# Patient Record
Sex: Male | Born: 1995 | Race: Black or African American | Hispanic: No | Marital: Single | Smoking: Never smoker
Health system: Southern US, Community
[De-identification: ages and names within clinical notes are randomized; demographics above are authoritative.]

---
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
## 2016-03-20 ENCOUNTER — Emergency Department: Payer: BLUE CROSS/BLUE SHIELD

## 2016-03-20 ENCOUNTER — Emergency Department
Admission: EM | Admit: 2016-03-20 | Discharge: 2016-03-20 | Disposition: A | Discharge status: Home / Self Care | Payer: BLUE CROSS/BLUE SHIELD | Attending: Emergency Medicine | Admitting: Emergency Medicine

## 2016-03-20 DIAGNOSIS — Y999 Unspecified external cause status: Secondary | ICD-10-CM | POA: Diagnosis not present

## 2016-03-20 DIAGNOSIS — Y929 Unspecified place or not applicable: Secondary | ICD-10-CM | POA: Insufficient documentation

## 2016-03-20 DIAGNOSIS — Y9367 Activity, basketball: Secondary | ICD-10-CM | POA: Insufficient documentation

## 2016-03-20 DIAGNOSIS — S63612A Unspecified sprain of right middle finger, initial encounter: Secondary | ICD-10-CM | POA: Diagnosis not present

## 2016-03-20 DIAGNOSIS — S6991XA Unspecified injury of right wrist, hand and finger(s), initial encounter: Secondary | ICD-10-CM | POA: Diagnosis present

## 2016-03-20 DIAGNOSIS — W2105XA Struck by basketball, initial encounter: Secondary | ICD-10-CM | POA: Diagnosis not present

## 2016-03-20 DIAGNOSIS — S63619A Unspecified sprain of unspecified finger, initial encounter: Secondary | ICD-10-CM

## 2016-03-20 MED ORDER — NAPROXEN 500 MG PO TABS
500.0000 mg | ORAL_TABLET | Freq: Once | ORAL | Status: AC
  Administered 2016-03-20: 500 mg via ORAL
  Filled 2016-03-20: qty 1

## 2016-03-20 MED ORDER — NAPROXEN 500 MG PO TABS: 500.0000 mg | ORAL_TABLET | Freq: Two times a day (BID) | ORAL | Status: AC

## 2016-03-20 NOTE — ED Notes (Signed)
Playing basketball two weeks ago and injured the right middle finger. PResents today with progressing pain and swelling. States his "mom made him come." Swelling noted to the finger. Motor/sensation intact.

## 2016-03-20 NOTE — ED Provider Notes (Signed)
[facility] Emergency Department Provider Note ____________________________________________  Time seen: Approximately 9:02 PM  I have reviewed the triage vital signs and the nursing notes.   HISTORY  Chief Complaint Finger Injury    HPI Cory Perry is a 20 y.o. male who presents to the emergency department for evaluation of right long finger injury. He states that he injured it while playing basketball about 2 weeks ago, but did not seek medical attention after that. He states that he jammed his finger again today while playing basketball. He states that he has severe pain when he attempts to make a fist on the right hand. He is right-hand dominant. He denies previous fracture or injury to the right hand and middle finger.  History reviewed. No pertinent past medical history.  There are no active problems to display for this patient.   History reviewed. No pertinent past surgical history.  Current Outpatient Rx  Name  Route  Sig  Dispense  Refill  . naproxen (NAPROSYN) 500 MG tablet   Oral   Take 1 tablet (500 mg total) by mouth 2 (two) times daily with a meal.   60 tablet   0     Allergies Review of patient's allergies indicates no known allergies.  No family history on file.  Social History Social History  Substance Use Topics  . Smoking status: None  . Smokeless tobacco: None  . Alcohol Use: None    Review of Systems Constitutional: No recent illness. Cardiovascular: Denies chest pain or palpitations. Respiratory: Denies shortness of breath. Musculoskeletal: Pain in Right hand. Skin: Negative for rash, wound, lesion. Neurological: Negative for focal weakness or numbness.  ____________________________________________   PHYSICAL EXAM:  VITAL SIGNS: ED Triage Vitals  Enc Vitals Group     BP 03/20/16 2010 119/71 mmHg     Pulse Rate 03/20/16 2010 90     Resp 03/20/16 2010 18     Temp 03/20/16 2010 98.7 F (37.1 C)   Temp Source 03/20/16 2010 Oral     SpO2 --      Weight 03/20/16 2010 235 lb (106.595 kg)     Height 03/20/16 2010 6\' 2"  (1.88 m)     Head Cir --      Peak Flow --      Pain Score 03/20/16 2010 8     Pain Loc --      Pain Edu? --      Excl. in GC? --     Constitutional: Alert and oriented. Well appearing and in no acute distress. Eyes: Conjunctivae are normal. EOMI. Head: Atraumatic. Neck: No stridor.  Respiratory: Normal respiratory effort.   Musculoskeletal: Edema noted of the right middle finger at the PIP. There is no erythema or associated lesion. Flexion is painful but possible. Extension is possible without pain. Neurologic:  Normal speech and language. No gross focal neurologic deficits are appreciated. Speech is normal. No gait instability. Skin:  Skin is warm, dry and intact. Atraumatic. Psychiatric: Mood and affect are normal. Speech and behavior are normal.  ____________________________________________   LABS (all labs ordered are listed, but only abnormal results are displayed)  Labs Reviewed - No data to display ____________________________________________  RADIOLOGY  Right middle finger negative for acute bony abnormality.  I, Kem Boroughsari Kaho Selle, personally viewed and evaluated these images (plain radiographs) as part of my medical decision making, as well as reviewing the written report by the radiologist.  ____________________________________________   PROCEDURES  Procedure(s) performed:   Aluminum-Foam  Static finger splint applied to right middle finger by ER tech. Neurovascularly intact post application.    ____________________________________________   INITIAL IMPRESSION / ASSESSMENT AND PLAN / ED COURSE  Pertinent labs & imaging results that were available during my care of the patient were reviewed by me and considered in my medical decision making (see chart for details).  Patient was encouraged to follow up with orthopedics for symptoms that  are not improving over the week. He was encouraged to return to the emergency department for symptoms change or worsen if he is unable schedule appointment. Be given prescription for naproxen. ____________________________________________   FINAL CLINICAL IMPRESSION(S) / ED DIAGNOSES  Final diagnoses:  Sprain of finger of right hand, initial encounter       Chinita Pester, FNP 03/20/16 2210  Jene Every, MD 03/20/16 2234

## 2016-03-20 NOTE — ED Notes (Signed)
Pt presents to ED with Right middle finger pain, swelling, and Limited ROM. Pt states he was playing basketball two weeks ago and injured the right middle finger. Pt states playing basketball again today and injury again.  Pt states progressing pain and swelling. Per Pt his "mom made him come." Swelling noted to the finger. Motor/sensation intact.

## 2016-03-20 NOTE — Discharge Instructions (Signed)
Jammed Finger  A jammed finger is an injury to the ligaments that support your finger bones. Ligaments are strong bands of tissue that connect bones and keep them in place. This injury happens when the ligaments are stretched beyond their normal range of motion (sprained).  CAUSES   A jammed finger is caused by a hard direct hit to the tip of your finger that pushes your finger toward your hand.   RISK FACTORS  This injury is more likely to happen if you play sports.  SYMPTOMS   Symptoms of a jammed finger include:   Pain.   Swelling.   Discoloration and bruising around the joint.   Difficulty bending or straightening the finger.   Not being able to use the finger normally.  DIAGNOSIS   A jammed finger is diagnosed with a medical history and physical exam. You may also have X-rays taken to check for a broken bone (fracture).   TREATMENT   Treatment for a jammed finger may include:   Wearing a splint.   Taping the injured finger to the fingers beside it (buddy taping).   Medicines used to treat pain.  Depending on the type of injury, you may have to do exercises after your finger has begun to heal. This helps you regain strength and mobility in the finger.   HOME CARE INSTRUCTIONS    Take medicines only as directed by your health care provider.   Apply ice to the injured area:     Put ice in a plastic bag.     Place a towel between your skin and the bag.     Leave the ice on for 20 minutes, 2-3 times per day.   Raise the injured area above the level of your heart while you are sitting or lying down.   Wear the splint or tape as directed by your health care provider. Remove it only as directed by your health care provider.   Rest your finger until your health care provider says you can move it again. Your finger may feel stiff and painful for a while.   Perform strengthening exercises as directed by your health care provider. It may help to start doing these exercises with your hand in a bowl of warm  water.   Keep all follow-up visits as directed by your health care provider. This is important.  SEEK MEDICAL CARE IF:   You have pain or swelling that is getting worse.   Your finger feels cold.   Your finger looks out of place at the joint (deformity).   You still cannot extend your finger after treatment.   You have a fever.  SEEK IMMEDIATE MEDICAL CARE IF:    Even after loosening your splint, your finger:    Is very red and swollen.    Is white or blue.    Feels tingly or becomes numb.     This information is not intended to replace advice given to you by your health care provider. Make sure you discuss any questions you have with your health care provider.     Document Released: 03/29/2010 Document Revised: 10/30/2014 Document Reviewed: 08/12/2014  Elsevier Interactive Patient Education 2016 [employer]

## 2016-10-20 ENCOUNTER — Emergency Department
Admission: EM | Admit: 2016-10-20 | Discharge: 2016-10-20 | Disposition: A | Discharge status: Home / Self Care | Payer: Self-pay | Attending: Emergency Medicine | Admitting: Emergency Medicine

## 2016-10-20 ENCOUNTER — Emergency Department: Payer: Self-pay

## 2016-10-20 ENCOUNTER — Encounter: Payer: Self-pay | Admitting: Emergency Medicine

## 2016-10-20 DIAGNOSIS — S93402A Sprain of unspecified ligament of left ankle, initial encounter: Secondary | ICD-10-CM | POA: Insufficient documentation

## 2016-10-20 DIAGNOSIS — X501XXA Overexertion from prolonged static or awkward postures, initial encounter: Secondary | ICD-10-CM | POA: Insufficient documentation

## 2016-10-20 DIAGNOSIS — Y9367 Activity, basketball: Secondary | ICD-10-CM | POA: Insufficient documentation

## 2016-10-20 DIAGNOSIS — Y999 Unspecified external cause status: Secondary | ICD-10-CM | POA: Insufficient documentation

## 2016-10-20 DIAGNOSIS — Y929 Unspecified place or not applicable: Secondary | ICD-10-CM | POA: Insufficient documentation

## 2016-10-20 MED ORDER — MELOXICAM 15 MG PO TABS: 15.0000 mg | ORAL_TABLET | Freq: Every day | ORAL | 0 refills | Status: DC

## 2016-10-20 NOTE — ED Notes (Signed)
See triage note  Injury to left ankle/lower leg  States he rolled his leg while playing b/b  Positive swelling noted

## 2016-10-20 NOTE — ED Triage Notes (Signed)
Patient was playing basketball approximately 30 minutes ago and he rolled his left ankle, patient reports that he heard a loud "crunch". Swelling noted in triage, pulses present.

## 2016-10-20 NOTE — ED Provider Notes (Signed)
[facility] Emergency Department Provider Note  ____________________________________________  Time seen: Approximately 3:46 PM  I have reviewed the triage vital signs and the nursing notes.   HISTORY  Chief Complaint Ankle Pain    HPI Cory Perry is a 20 y.o. male who presents to emergency department complaining of pain to the left lateral ankle. Patient was playing basketball when he juked a player and as he went into the air he landed on the other player's foot rolling his ankle in an inversion manner. Patient was initially having pain to the proximal tibia. Patient states that the pain has now moved into the left lateral ankle. Patient does report swelling to the area. He is unable to bear weight on the ankle. Patient denies any nose or tingling distally. No other injury or complaint.   History reviewed. No pertinent past medical history.  There are no active problems to display for this patient.   History reviewed. No pertinent surgical history.  Prior to Admission medications   Medication Sig Start Date End Date Taking? Authorizing Provider  meloxicam (MOBIC) 15 MG tablet Take 1 tablet (15 mg total) by mouth daily. 10/20/16   Delorise RoyalsJonathan D Cuthriell, PA-C  naproxen (NAPROSYN) 500 MG tablet Take 1 tablet (500 mg total) by mouth 2 (two) times daily with a meal. 03/20/16 03/20/17  Chinita Pesterari B Triplett, FNP    Allergies Patient has no known allergies.  No family history on file.  Social History Social History  Substance Use Topics  . Smoking status: Never Smoker  . Smokeless tobacco: Never Used  . Alcohol use No     Review of Systems  Constitutional: No fever/chills Cardiovascular: no chest pain. Respiratory: no cough. No SOB. Musculoskeletal: Positive for left ankle pain Skin: Negative for rash, abrasions, lacerations, ecchymosis. Neurological: Negative for headaches, focal weakness or numbness. 10-point ROS otherwise  negative.  ____________________________________________   PHYSICAL EXAM:  VITAL SIGNS: ED Triage Vitals  Enc Vitals Group     BP 10/20/16 1525 (!) 162/128     Pulse Rate 10/20/16 1525 (!) 105     Resp 10/20/16 1525 16     Temp 10/20/16 1525 97.5 F (36.4 C)     Temp Source 10/20/16 1525 Oral     SpO2 10/20/16 1525 100 %     Weight 10/20/16 1526 258 lb (117 kg)     Height 10/20/16 1526 6\' 2"  (1.88 m)     Head Circumference --      Peak Flow --      Pain Score 10/20/16 1525 8     Pain Loc --      Pain Edu? --      Excl. in GC? --      Constitutional: Alert and oriented. Well appearing and in no acute distress. Eyes: Conjunctivae are normal. PERRL. EOMI. Head: Atraumatic. Neck: No stridor.    Cardiovascular: Normal rate, regular rhythm. Normal S1 and S2.  Good peripheral circulation. Respiratory: Normal respiratory effort without tachypnea or retractions. Lungs CTAB. Good air entry to the bases with no decreased or absent breath sounds. Musculoskeletal: Full range of motion to all extremities. No gross deformities appreciated.Mild edema noted to the lateral left ankle. Patient is very tender to palpation over the lateral malleolus. Limited range of motion due to pain. No other tenderness to palpation. Dorsalis pedis pulse intact. Sensation intact 5 digits. Cap refill intact 5 digits. Neurologic:  Normal speech and language. No gross focal neurologic deficits are appreciated.  Skin:  Skin is warm, dry and intact. No rash noted. Psychiatric: Mood and affect are normal. Speech and behavior are normal. Patient exhibits appropriate insight and judgement.   ____________________________________________   LABS (all labs ordered are listed, but only abnormal results are displayed)  Labs Reviewed - No data to display ____________________________________________  EKG   ____________________________________________  RADIOLOGY Festus BarrenI, Jonathan D Cuthriell, personally viewed and  evaluated these images (plain radiographs) as part of my medical decision making, as well as reviewing the written report by the radiologist.  Dg Tibia/fibula Left  Result Date: 10/20/2016 CLINICAL DATA:  Twisted playing basketball today. Tender and swollen left ankle, pain left ankle to proximal mid tibia EXAM: LEFT TIBIA AND FIBULA - 2 VIEW COMPARISON:  None. FINDINGS: There is significant soft tissue swelling along the lateral and anterior aspect of the ankle. A fracture the lateral malleolus is possible, but not well demonstrated on the anterior view. Proximal tibia and fibula are intact. IMPRESSION: 1. Suspect fracture of the lateral malleolus. 2. Recommend dedicated ankle series for further evaluation. 3. Significant soft tissue swelling of the lateral and anterior aspect of the ankle. Electronically Signed   By: Norva PavlovElizabeth  Brown M.D.   On: 10/20/2016 15:49   Dg Ankle Complete Left  Result Date: 10/20/2016 CLINICAL DATA:  Left ankle injury playing basketball. EXAM: LEFT ANKLE COMPLETE - 3+ VIEW COMPARISON:  Tib-fib film from earlier today. FINDINGS: No fracture. No subluxation. Ankle mortise is preserved. The finding on the tib-fib film from earlier today represents residua of the epiphyseal line in the distal fibula. IMPRESSION: Negative. Electronically Signed   By: Kennith CenterEric  Mansell M.D.   On: 10/20/2016 16:12    ____________________________________________    PROCEDURES  Procedure(s) performed:    Procedures    Medications - No data to display   ____________________________________________   INITIAL IMPRESSION / ASSESSMENT AND PLAN / ED COURSE  Pertinent labs & imaging results that were available during my care of the patient were reviewed by me and considered in my medical decision making (see chart for details).  Review of the  [place]RS was performed in accordance of the [place] prior to dispensing any controlled drugs.  Clinical Course     Patient's diagnosis is  consistent with Left ankle sprain. Initial x-ray revealed possible adenoid to the lateral malleolus. Dedicated ankle films were undertaken with no acute osseous abnormality. Patient's ankle is wrapped with Ace bandage and he is given crutches in the emergency department. He is to use ankle brace and his return to activity. Patient will follow-up with orthopedics for any continued or complaint.. Patient will be discharged home with prescriptions for anti-inflammatories.  Patient is given ED precautions to return to the ED for any worsening or new symptoms.     ____________________________________________  FINAL CLINICAL IMPRESSION(S) / ED DIAGNOSES  Final diagnoses:  Sprain of left ankle, unspecified ligament, initial encounter      NEW MEDICATIONS STARTED DURING THIS VISIT:  New Prescriptions   MELOXICAM (MOBIC) 15 MG TABLET    Take 1 tablet (15 mg total) by mouth daily.        This chart was dictated using voice recognition software/Dragon. Despite best efforts to proofread, errors can occur which can change the meaning. Any change was purely unintentional.    Racheal PatchesJonathan D Cuthriell, PA-C 10/20/16 1644    Loleta Roseory Forbach, MD 10/20/16 2018

## 2016-10-23 ENCOUNTER — Emergency Department: Payer: Self-pay

## 2016-10-23 ENCOUNTER — Emergency Department
Admission: EM | Admit: 2016-10-23 | Discharge: 2016-10-23 | Disposition: A | Discharge status: Home / Self Care | Payer: Self-pay | Attending: Emergency Medicine | Admitting: Emergency Medicine

## 2016-10-23 DIAGNOSIS — Y9367 Activity, basketball: Secondary | ICD-10-CM | POA: Insufficient documentation

## 2016-10-23 DIAGNOSIS — Y999 Unspecified external cause status: Secondary | ICD-10-CM | POA: Insufficient documentation

## 2016-10-23 DIAGNOSIS — S93602A Unspecified sprain of left foot, initial encounter: Secondary | ICD-10-CM | POA: Insufficient documentation

## 2016-10-23 DIAGNOSIS — S93402A Sprain of unspecified ligament of left ankle, initial encounter: Secondary | ICD-10-CM | POA: Insufficient documentation

## 2016-10-23 DIAGNOSIS — Z791 Long term (current) use of non-steroidal anti-inflammatories (NSAID): Secondary | ICD-10-CM | POA: Insufficient documentation

## 2016-10-23 DIAGNOSIS — X501XXA Overexertion from prolonged static or awkward postures, initial encounter: Secondary | ICD-10-CM | POA: Insufficient documentation

## 2016-10-23 DIAGNOSIS — Y929 Unspecified place or not applicable: Secondary | ICD-10-CM | POA: Insufficient documentation

## 2016-10-23 MED ORDER — CEPHALEXIN 500 MG PO CAPS: 500.0000 mg | ORAL_CAPSULE | Freq: Three times a day (TID) | ORAL | 0 refills | Status: DC

## 2016-10-23 MED ORDER — IBUPROFEN 600 MG PO TABS: 600.0000 mg | ORAL_TABLET | Freq: Three times a day (TID) | ORAL | 0 refills | Status: DC | PRN

## 2016-10-23 NOTE — Discharge Instructions (Signed)
Wear splint until seen by orthopedist. Do not bear weight, use your crutches.   Ice and elevation to help reduce swelling and manage pain. Begin Keflex which is an antibiotic for infection coverage. Ibuprofen with food if needed for pain. Call [facility] and [facility].

## 2016-10-23 NOTE — ED Provider Notes (Signed)
[facility] Emergency Department Provider Note  ____________________________________________   First MD Initiated Contact with Patient 10/23/16 1552     (approximate)  I have reviewed the triage vital signs and the nursing notes.   HISTORY  Chief Complaint Ankle Pain    HPI Cory Perry is a 21 y.o. male is here with complaint of continued foot and ankle pain.Patient was seen on 10/20/16 after an injury to his ankle while playing basketball. Patient states that the injury was of a twisting nature. Patient was seen in the emergency room at that time where x-rays of his ankle did not show any acute bony injury or questionable avulsion fracture mentioned. Patient was given a prescription for meloxicam which he states he continues to take. He is also placed on crutches and told to follow up with orthopedics if any continued problems which he states he has not been able to do due to the holiday. Patient has been using ice and alternating with heat. He states that last evening he slept with the heating pad on his foot. At this time he states that there is increased swelling and is worse than when he was seen initially. He rates his pain as an 8/10.   History reviewed. No pertinent past medical history.  There are no active problems to display for this patient.   History reviewed. No pertinent surgical history.  Prior to Admission medications   Medication Sig Start Date End Date Taking? Authorizing Provider  cephALEXin (KEFLEX) 500 MG capsule Take 1 capsule (500 mg total) by mouth 3 (three) times daily. 10/23/16   Tommi Rumps, PA-C  ibuprofen (ADVIL,MOTRIN) 600 MG tablet Take 1 tablet (600 mg total) by mouth every 8 (eight) hours as needed. 10/23/16   Tommi Rumps, PA-C  meloxicam (MOBIC) 15 MG tablet Take 1 tablet (15 mg total) by mouth daily. 10/20/16   Delorise Royals Cuthriell, PA-C  naproxen (NAPROSYN) 500 MG tablet Take 1 tablet (500 mg total) by  mouth 2 (two) times daily with a meal. 03/20/16 03/20/17  Chinita Pester, FNP    Allergies Patient has no known allergies.  No family history on file.  Social History Social History  Substance Use Topics  . Smoking status: Never Smoker  . Smokeless tobacco: Never Used  . Alcohol use No    Review of Systems Constitutional: No fever/chills Cardiovascular: Denies chest pain. Respiratory: Denies shortness of breath. Gastrointestinal:  No nausea, no vomiting.  Musculoskeletal: Positive for left ankle and foot pain Skin: Positive for swelling. Neurological: Negative for headaches, focal weakness or numbness.  10-point ROS otherwise negative.  ____________________________________________   PHYSICAL EXAM:  VITAL SIGNS: ED Triage Vitals  Enc Vitals Group     BP 10/23/16 1510 121/88     Pulse Rate 10/23/16 1510 63     Resp 10/23/16 1510 16     Temp 10/23/16 1510 98.1 F (36.7 C)     Temp Source 10/23/16 1510 Oral     SpO2 10/23/16 1510 100 %     Weight 10/23/16 1511 258 lb (117 kg)     Height 10/23/16 1511 6\' 2"  (1.88 m)     Head Circumference --      Peak Flow --      Pain Score 10/23/16 1511 8     Pain Loc --      Pain Edu? --      Excl. in GC? --     Constitutional: Alert and oriented.  Well appearing and in no acute distress. Eyes: Conjunctivae are normal. PERRL. EOMI. Head: Atraumatic. Nose: No congestion/rhinnorhea. Neck: No stridor.   Cardiovascular: Normal rate, regular rhythm. Grossly normal heart sounds.  Good peripheral circulation. Respiratory: Normal respiratory effort.  No retractions. Lungs CTAB. Musculoskeletal: Examination of the left foot and ankle there is marked amount of soft tissue edema present. There is some ecchymosis noted on the lateral aspect at the lateral malleolus. Dorsum of the left foot with marked soft tissue swelling that extends down into the digits. There continues to be tenderness on palpation of the lateral malleolus. There is  tenderness on palpation of the dorsum of the left foot. Digits are nontender. Capillary refill less than 3 seconds. Neurologic:  Normal speech and language. No gross focal neurologic deficits are appreciated. No gait instability. Skin:  Skin is warm, dry and intact. Ecchymosis is present lateral malleolus. There is some isolated ecchymosis noted at the base of the second third and fourth digit dorsal aspect. There is no puncture wounds noted. Skin is intact. Psychiatric: Mood and affect are normal. Speech and behavior are normal.  ____________________________________________   LABS (all labs ordered are listed, but only abnormal results are displayed)  Labs Reviewed - No data to display RADIOLOGY  Left ankle per radiologist: IMPRESSION: Small avulsion fracture versus foreign body inferior to the lateral malleolus.  Extensive soft tissue swelling medially and laterally. Findings conveyed toRHONDA SUMMERS on 10/23/2016 at17:05.   Left foot x-ray per radiologist: IMPRESSION: 1. Small avulsion fracture versus foreign body inferior to the lateral malleolus. Favor foreign body. 2. No evidence of fracture of the foot. 3. Extensive soft tissue swelling over the ankles as well as the forefoot. Findings conveyed toRHONDA SUMMERS on 10/23/2016 at17:  Beaulah CorinI, Rhonda L Summers, personally discussed these images and results by phone with the on-call radiologist and used this discussion as part of my medical decision making.  I, Tommi Rumpshonda L Summers, personally viewed and evaluated these images (plain radiographs) as part of my medical decision making, as well as reviewing the written report by the radiologist.  ____________________________________________   PROCEDURES  Procedure(s) performed: None  Procedures  Critical Care performed: No  ____________________________________________   INITIAL IMPRESSION / ASSESSMENT AND PLAN / ED COURSE  Pertinent labs & imaging results that were available  during my care of the patient were reviewed by me and considered in my medical decision making (see chart for details).    Clinical Course    Patient denies any reinjury since being seen in the emergency room initially. He also denies any foreign body or injury due to cats. He denies any injection with needles. He states there was no broken glass or metallic objects at the site where he had his injury. After discussion with the radiologist and the worsening symptoms voiced by the patient is placed on Keflex 500 mg 3 times a day for coverage for questionable foreign body. Patient was also placed in a sugar tong and posterior OCL. He is encouraged to continue using his crutches. He is given a prescription for ibuprofen 600 mg every 8 hours if needed for pain. Patient was [facility] for an appointment.he was also given a note to remain out of work.  ____________________________________________   FINAL CLINICAL IMPRESSION(S) / ED DIAGNOSES  Final diagnoses:  Sprain of left ankle, unspecified ligament, initial encounter  Sprain of left foot, initial encounter      NEW MEDICATIONS STARTED DURING THIS VISIT:  Discharge Medication List as of 10/23/2016  5:32 PM    START taking these medications   Details  cephALEXin (KEFLEX) 500 MG capsule Take 1 capsule (500 mg total) by mouth 3 (three) times daily., Starting Mon 10/23/2016, Print    ibuprofen (ADVIL,MOTRIN) 600 MG tablet Take 1 tablet (600 mg total) by mouth every 8 (eight) hours as needed., Starting Mon 10/23/2016, Print         Note:  This document was prepared using Dragon voice recognition software and may include unintentional dictation errors.     Tommi Rumps, PA-C 10/23/16 1844    Sharman Cheek, MD 10/25/16 [phone]

## 2016-10-23 NOTE — ED Triage Notes (Signed)
Left ankle pain since 10/20/16. No improvement since being seen initially.

## 2017-10-21 ENCOUNTER — Emergency Department
Admission: EM | Admit: 2017-10-21 | Discharge: 2017-10-21 | Disposition: A | Discharge status: Home / Self Care | Payer: BLUE CROSS/BLUE SHIELD | Attending: Emergency Medicine | Admitting: Emergency Medicine

## 2017-10-21 ENCOUNTER — Encounter: Payer: Self-pay | Admitting: Emergency Medicine

## 2017-10-21 ENCOUNTER — Other Ambulatory Visit: Payer: Self-pay

## 2017-10-21 DIAGNOSIS — Y929 Unspecified place or not applicable: Secondary | ICD-10-CM | POA: Insufficient documentation

## 2017-10-21 DIAGNOSIS — S161XXA Strain of muscle, fascia and tendon at neck level, initial encounter: Secondary | ICD-10-CM | POA: Insufficient documentation

## 2017-10-21 DIAGNOSIS — Y999 Unspecified external cause status: Secondary | ICD-10-CM | POA: Insufficient documentation

## 2017-10-21 DIAGNOSIS — Y93B3 Activity, free weights: Secondary | ICD-10-CM | POA: Insufficient documentation

## 2017-10-21 DIAGNOSIS — X500XXA Overexertion from strenuous movement or load, initial encounter: Secondary | ICD-10-CM | POA: Insufficient documentation

## 2017-10-21 MED ORDER — LIDOCAINE 5 % EX PTCH
1.0000 | MEDICATED_PATCH | CUTANEOUS | Status: DC
  Administered 2017-10-21: 1 via TRANSDERMAL
  Filled 2017-10-21: qty 1

## 2017-10-21 MED ORDER — KETOROLAC TROMETHAMINE 30 MG/ML IJ SOLN
30.0000 mg | Freq: Once | INTRAMUSCULAR | Status: AC
  Administered 2017-10-21: 30 mg via INTRAMUSCULAR
  Filled 2017-10-21: qty 1

## 2017-10-21 MED ORDER — LIDOCAINE 5 % EX PTCH: 1.0000 | MEDICATED_PATCH | Freq: Two times a day (BID) | CUTANEOUS | 0 refills | Status: AC

## 2017-10-21 MED ORDER — CYCLOBENZAPRINE HCL 5 MG PO TABS: 5.0000 mg | ORAL_TABLET | Freq: Three times a day (TID) | ORAL | 0 refills | Status: AC | PRN

## 2017-10-21 MED ORDER — IBUPROFEN 800 MG PO TABS: 800.0000 mg | ORAL_TABLET | Freq: Three times a day (TID) | ORAL | 0 refills | Status: AC | PRN

## 2017-10-21 NOTE — ED Notes (Signed)
Noticed pain on right side of neck through finger tips on right hand. Noted pain after playing basket ball while in steam room.

## 2017-10-21 NOTE — ED Provider Notes (Signed)
[facility] Emergency Department Provider Note  ____________________________________________  Time seen: Approximately 3:05 PM  I have reviewed the triage vital signs and the nursing notes.   HISTORY  Chief Complaint Arm Pain (Right) and Shoulder Pain (Right)    HPI Cory Perry is a 21 y.o. male that presents to the emergency department for evaluation of pain to the right side of his neck for 2 days.  Patient states that he was lifting weights at the gym yesterday and then proceeded to play basketball after.  He played basketball at the gym this morning as well.  He is noticing that the muscles on the right side of his neck feel tight and he is having difficulty moving his neck in either direction.  Occasionally he gets shooting pains down his right arm.  No alleviating measures have been attempted.  No specific injury to neck. No recent illness. No fever, SOB, CP, nausea, vomiting, abdominal pain.   History reviewed. No pertinent past medical history.  There are no active problems to display for this patient.   History reviewed. No pertinent surgical history.  Prior to Admission medications   Medication Sig Start Date End Date Taking? Authorizing Provider  cephALEXin (KEFLEX) 500 MG capsule Take 1 capsule (500 mg total) by mouth 3 (three) times daily. 10/23/16   Tommi RumpsSummers, Rhonda L, PA-C  cyclobenzaprine (FLEXERIL) 5 MG tablet Take 1 tablet (5 mg total) by mouth 3 (three) times daily as needed for up to 7 days for muscle spasms. 10/21/17 10/28/17  Enid DerryWagner, Lisbeth Puller, PA-C  ibuprofen (ADVIL,MOTRIN) 800 MG tablet Take 1 tablet (800 mg total) by mouth every 8 (eight) hours as needed. 10/21/17   Enid DerryWagner, Nels Munn, PA-C  lidocaine (LIDODERM) 5 % Place 1 patch onto the skin every 12 (twelve) hours. Remove & Discard patch within 12 hours or as directed by MD 10/21/17 10/21/18  Enid DerryWagner, Imelda Dandridge, PA-C  meloxicam (MOBIC) 15 MG tablet Take 1 tablet (15 mg total) by mouth daily.  10/20/16   Cuthriell, Delorise RoyalsJonathan D, PA-C    Allergies Patient has no known allergies.  History reviewed. No pertinent family history.  Social History Social History   Tobacco Use  . Smoking status: Never Smoker  . Smokeless tobacco: Never Used  Substance Use Topics  . Alcohol use: No  . Drug use: Not on file     Review of Systems  Constitutional: No fever/chills Cardiovascular: No chest pain. Respiratory: No SOB. Gastrointestinal: No abdominal pain.  No nausea, no vomiting.  Musculoskeletal: Positive for neck and shoulder pain Skin: Negative for rash, abrasions, lacerations, ecchymosis. Neurological: Negative for headaches   ____________________________________________   PHYSICAL EXAM:  VITAL SIGNS: ED Triage Vitals  Enc Vitals Group     BP 10/21/17 1308 126/71     Pulse Rate 10/21/17 1308 76     Resp 10/21/17 1308 16     Temp 10/21/17 1308 98.5 F (36.9 C)     Temp Source 10/21/17 1308 Oral     SpO2 10/21/17 1308 98 %     Weight 10/21/17 1307 275 lb (124.7 kg)     Height 10/21/17 1307 6\' 2"  (1.88 m)     Head Circumference --      Peak Flow --      Pain Score 10/21/17 1306 8     Pain Loc --      Pain Edu? --      Excl. in GC? --      Constitutional: Alert and oriented.  Well appearing and in no acute distress. Eyes: Conjunctivae are normal. PERRL. EOMI. Head: Atraumatic. ENT:      Ears:      Nose: No congestion/rhinnorhea.      Mouth/Throat: Mucous membranes are moist.  Neck: No stridor.  No cervical spine tenderness to palpation. Tenderness to palpation over right trapezius muscle. Pain with left rotation. Cardiovascular: Normal rate, regular rhythm.  Good peripheral circulation. Symmetric radial pulses bilaterally. Respiratory: Normal respiratory effort without tachypnea or retractions. Lungs CTAB. Good air entry to the bases with no decreased or absent breath sounds. Musculoskeletal: Full range of motion to all extremities. No gross deformities  appreciated. Neurologic:  Normal speech and language. No gross focal neurologic deficits are appreciated.  Skin:  Skin is warm, dry and intact. No rash noted. Psychiatric: Mood and affect are normal. Speech and behavior are normal. Patient exhibits appropriate insight and judgement.   ____________________________________________   LABS (all labs ordered are listed, but only abnormal results are displayed)  Labs Reviewed - No data to display ____________________________________________  EKG   ____________________________________________  RADIOLOGY   No results found.  ____________________________________________    PROCEDURES  Procedure(s) performed:    Procedures    Medications  lidocaine (LIDODERM) 5 % 1 patch (1 patch Transdermal Patch Applied 10/21/17 1451)  ketorolac (TORADOL) 30 MG/ML injection 30 mg (30 mg Intramuscular Given 10/21/17 1451)     ____________________________________________   INITIAL IMPRESSION / ASSESSMENT AND PLAN / ED COURSE  Pertinent labs & imaging results that were available during my care of the patient were reviewed by me and considered in my medical decision making (see chart for details).  Review of the [place] CSRS was performed in accordance of the [facility] prior to dispensing any controlled drugs.   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  Vital signs and exam are reassuring.  No trauma to neck. Patient was given IM Toradol in ED.  He is driving so he was not given any muscle relaxer.  Patient will be discharged home with prescriptions for Flexeril, Lidoderm, Advil. Patient is to follow up with PCP as directed. Patient is given ED precautions to return to the ED for any worsening or new symptoms.     ____________________________________________  FINAL CLINICAL IMPRESSION(S) / ED DIAGNOSES  Final diagnoses:  Strain of neck muscle, initial encounter      NEW MEDICATIONS STARTED DURING THIS VISIT:  ED Discharge  Orders        Ordered    ibuprofen (ADVIL,MOTRIN) 800 MG tablet  Every 8 hours PRN     10/21/17 1509    cyclobenzaprine (FLEXERIL) 5 MG tablet  3 times daily PRN     10/21/17 1509    lidocaine (LIDODERM) 5 %  Every 12 hours     10/21/17 1509          This chart was dictated using voice recognition software/Dragon. Despite best efforts to proofread, errors can occur which can change the meaning. Any change was purely unintentional.    Enid DerryWagner, Khushboo Chuck, PA-C 10/21/17 1543    Minna AntisPaduchowski, Kevin, MD 10/23/17 [phone]

## 2017-10-21 NOTE — ED Triage Notes (Signed)
Pt reports pulled muscle a few days ago at the gym on the right side at the base of the neck, states the pain radiates down to the tip of fingers. Pt states painful to turn head to the right.

## 2018-02-19 IMAGING — DX DG ANKLE COMPLETE 3+V*L*
3 series · 3 of 3 positions shown · non-contrast
Comparison: Tib-fib film from earlier today.

CLINICAL DATA: Left ankle injury playing basketball.

EXAM:
LEFT ANKLE COMPLETE - 3+ VIEW

[ankle ap]
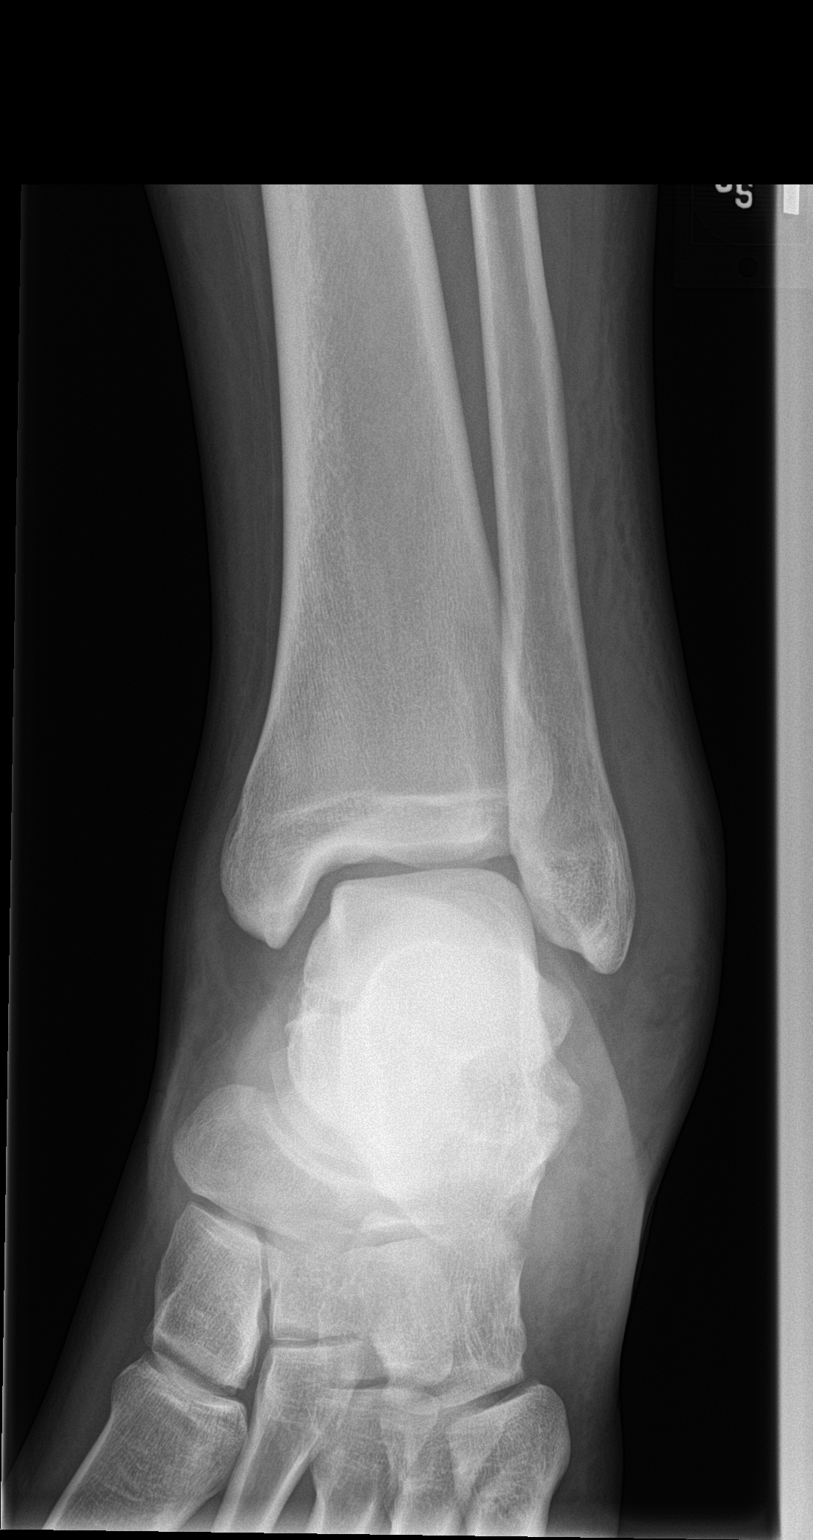

[ankle obl]
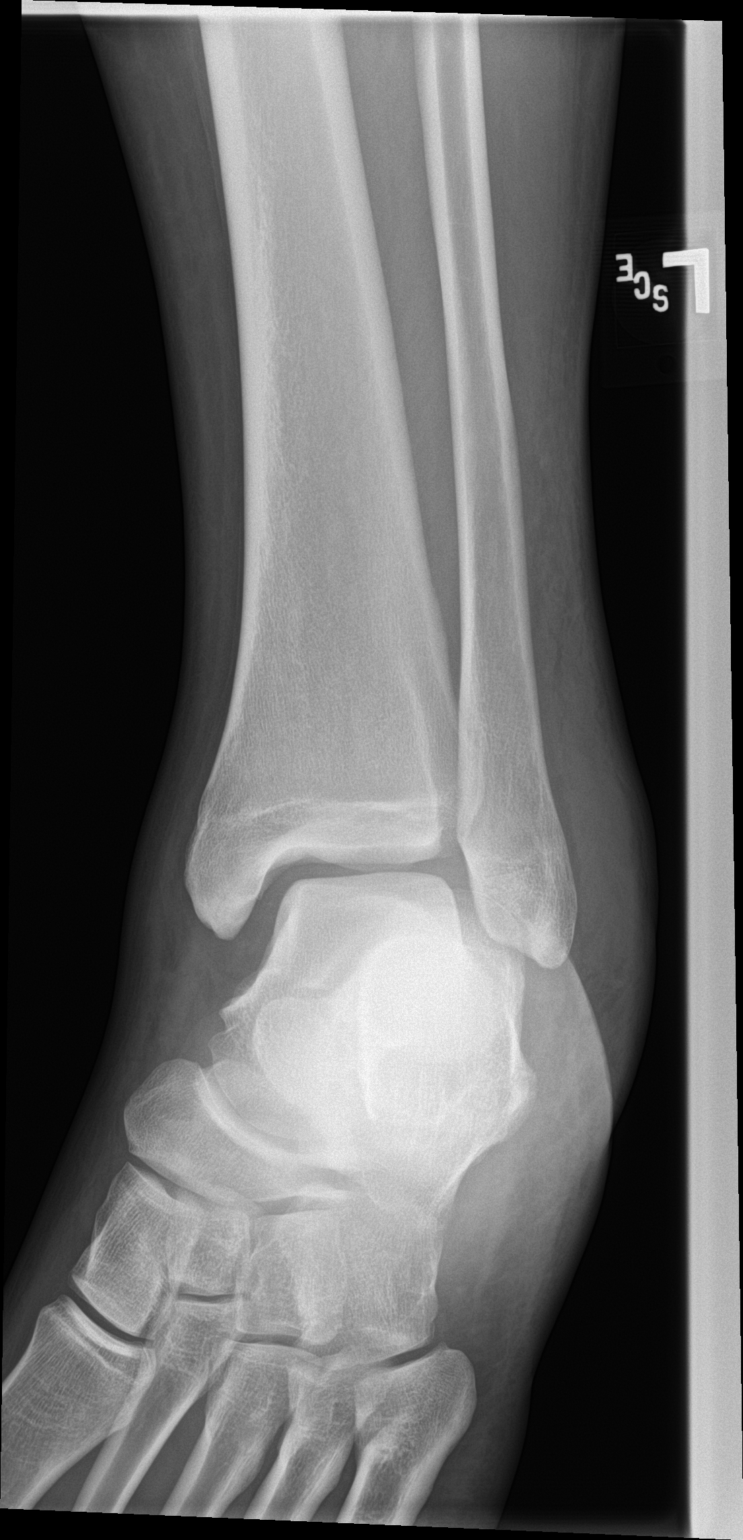

[ankle lat]
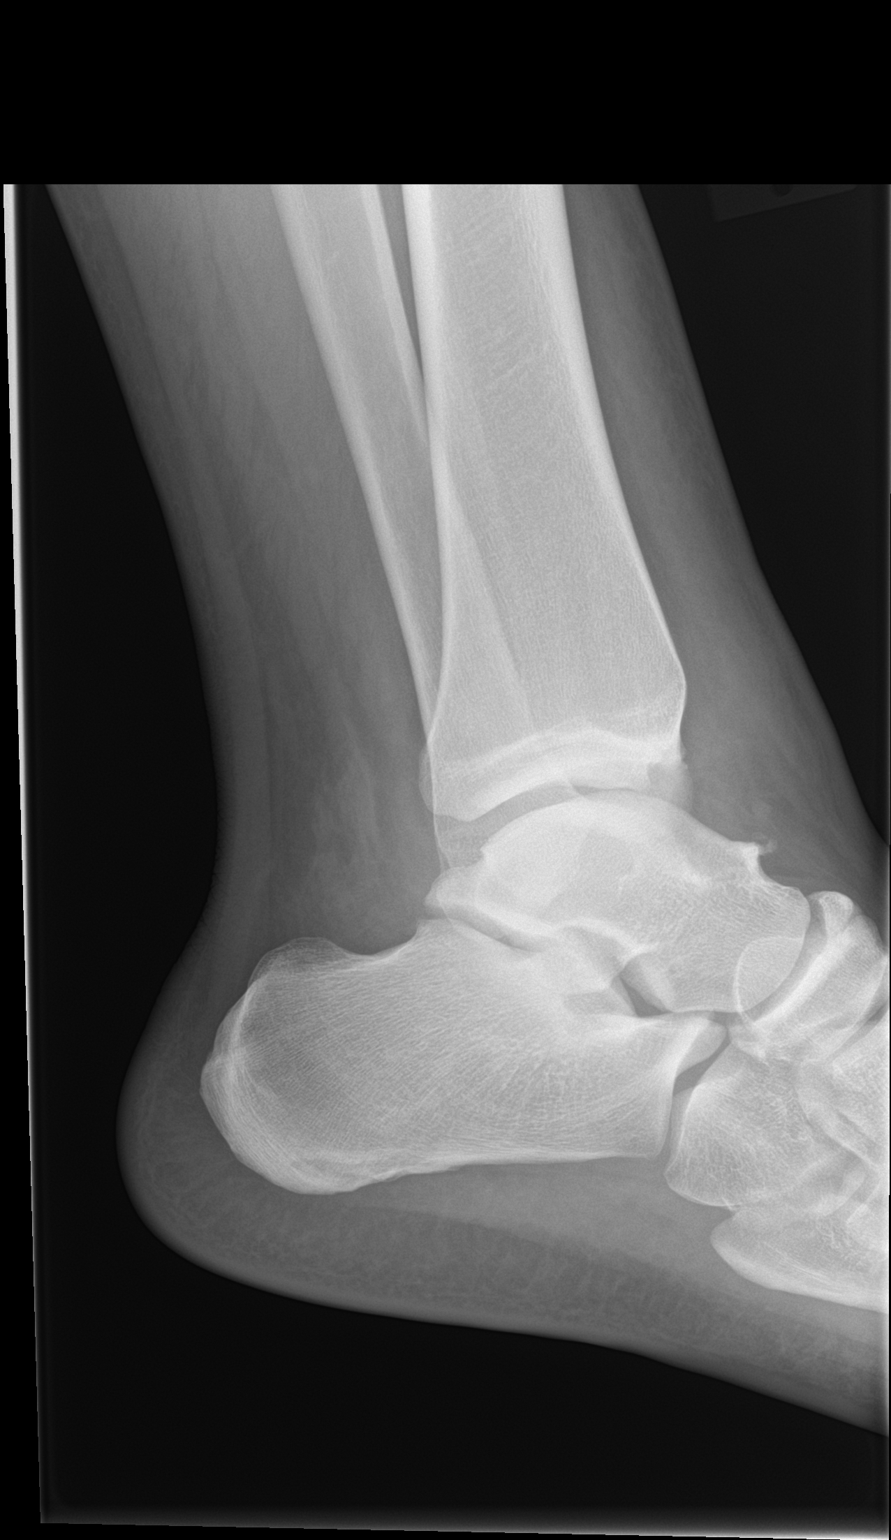

[3 of 3 positions shown; findings below may reference images not displayed]

FINDINGS: No fracture. No subluxation. Ankle mortise is preserved. The finding
on the tib-fib film from earlier today represents residua of the
epiphyseal line in the distal fibula.
IMPRESSION: Negative.

## 2018-02-19 IMAGING — CR DG TIBIA/FIBULA 2V*L*
1 series · 4 of 4 positions shown · non-contrast
Comparison: None.

CLINICAL DATA: Twisted playing basketball today. Tender and swollen
left ankle, pain left ankle to proximal mid tibia

EXAM:
LEFT TIBIA AND FIBULA - 2 VIEW

[Series 1: dg tibia/fibula left · 0.14mm/px · 4 of 4 slices shown]
[im 1/4]
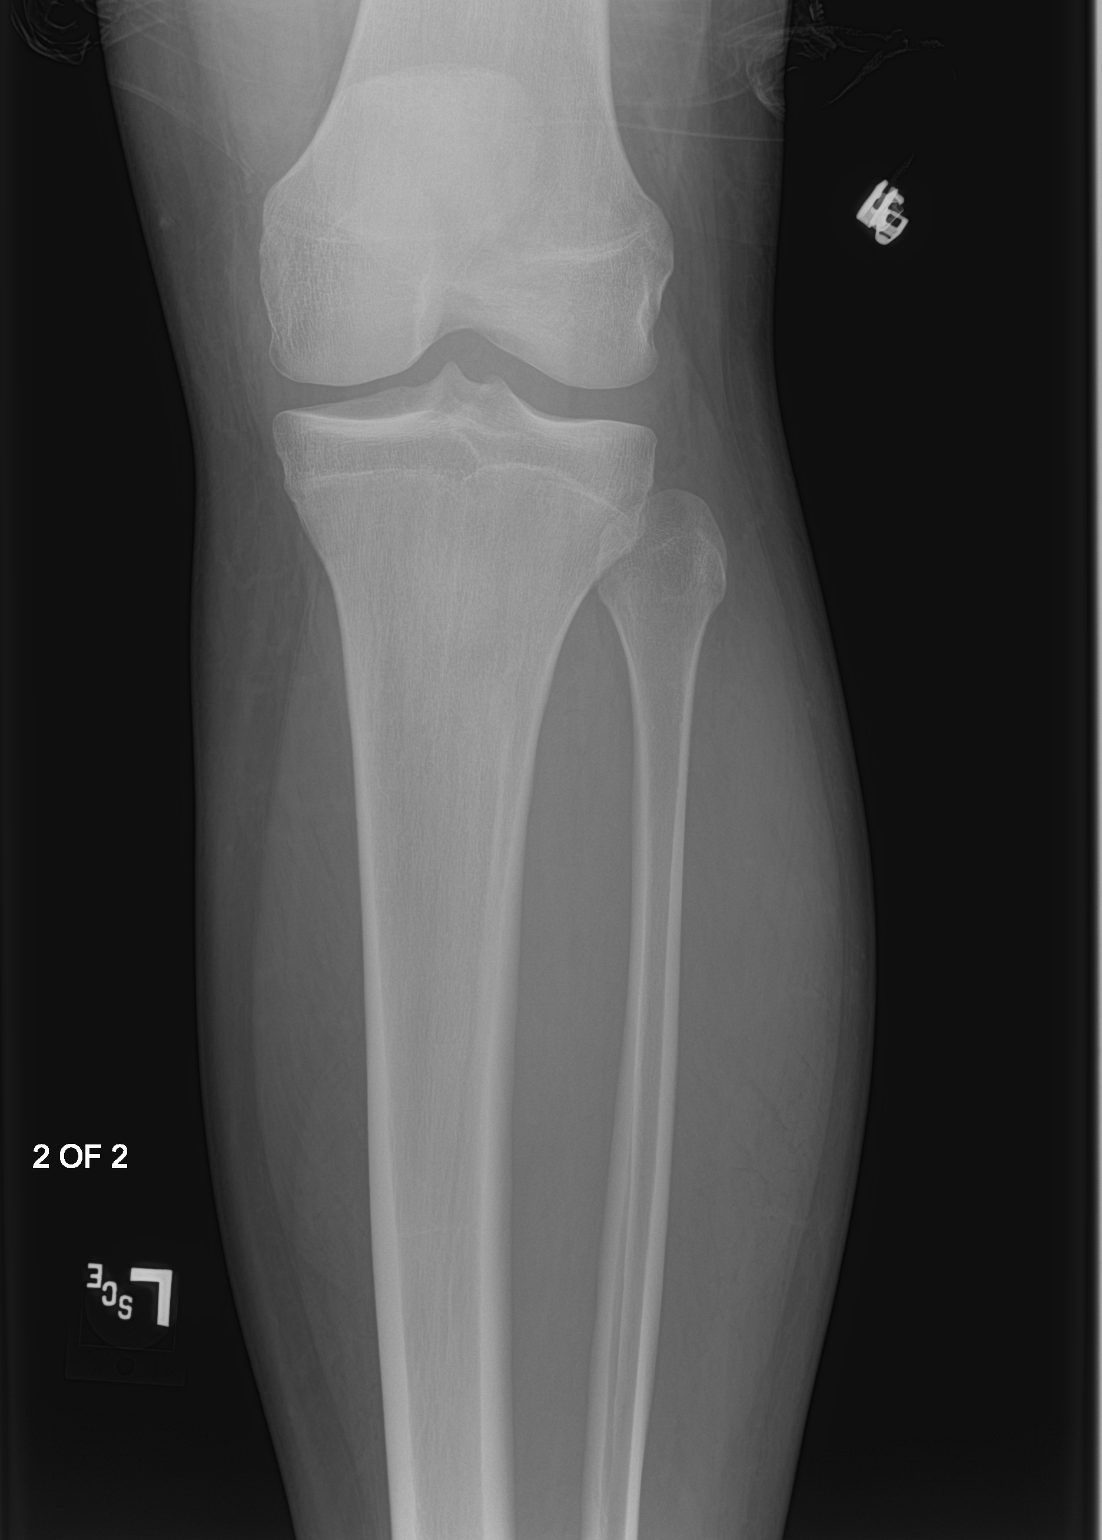
[im 2/4]
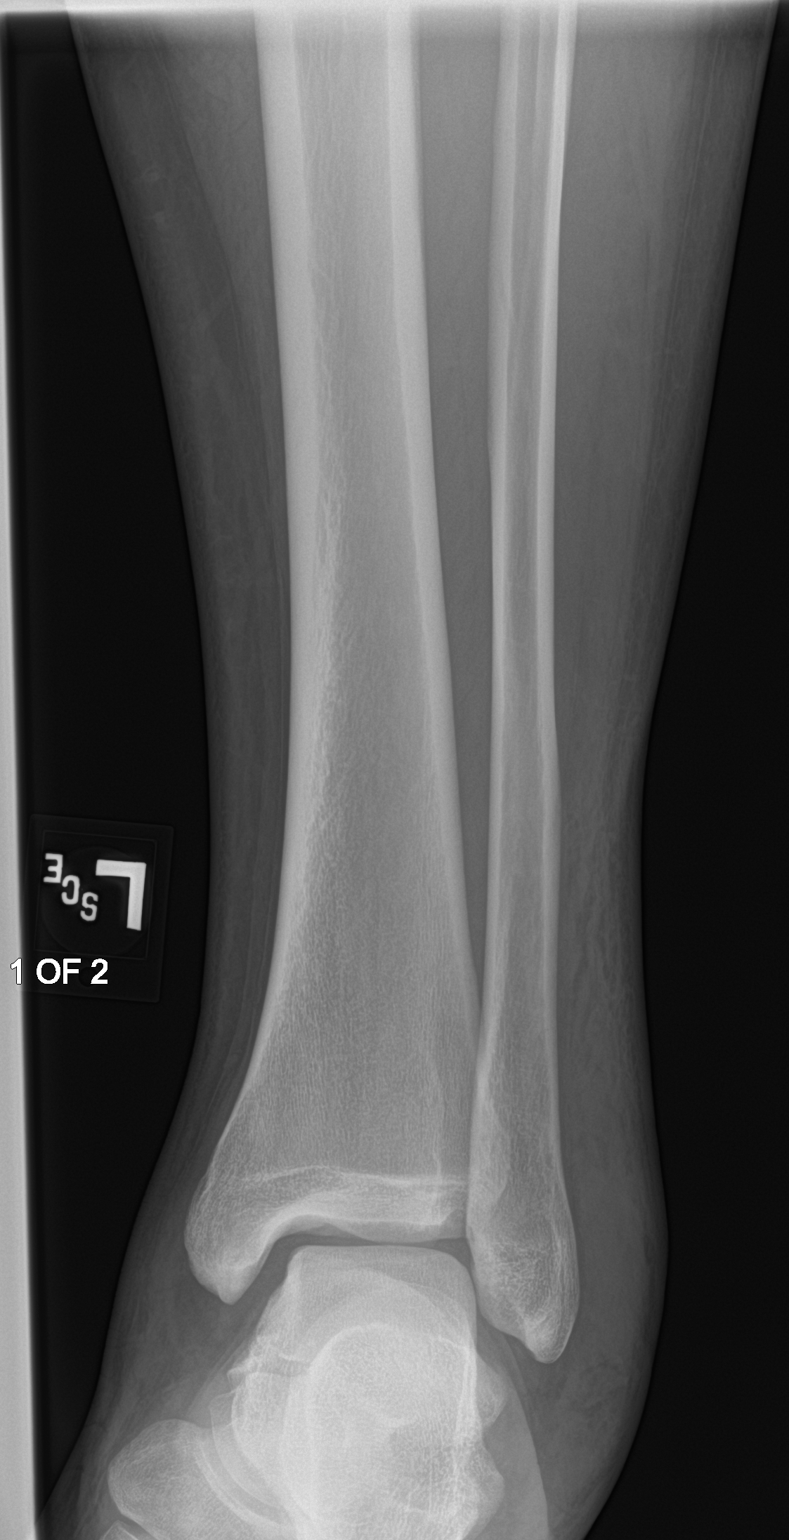
[im 3/4]
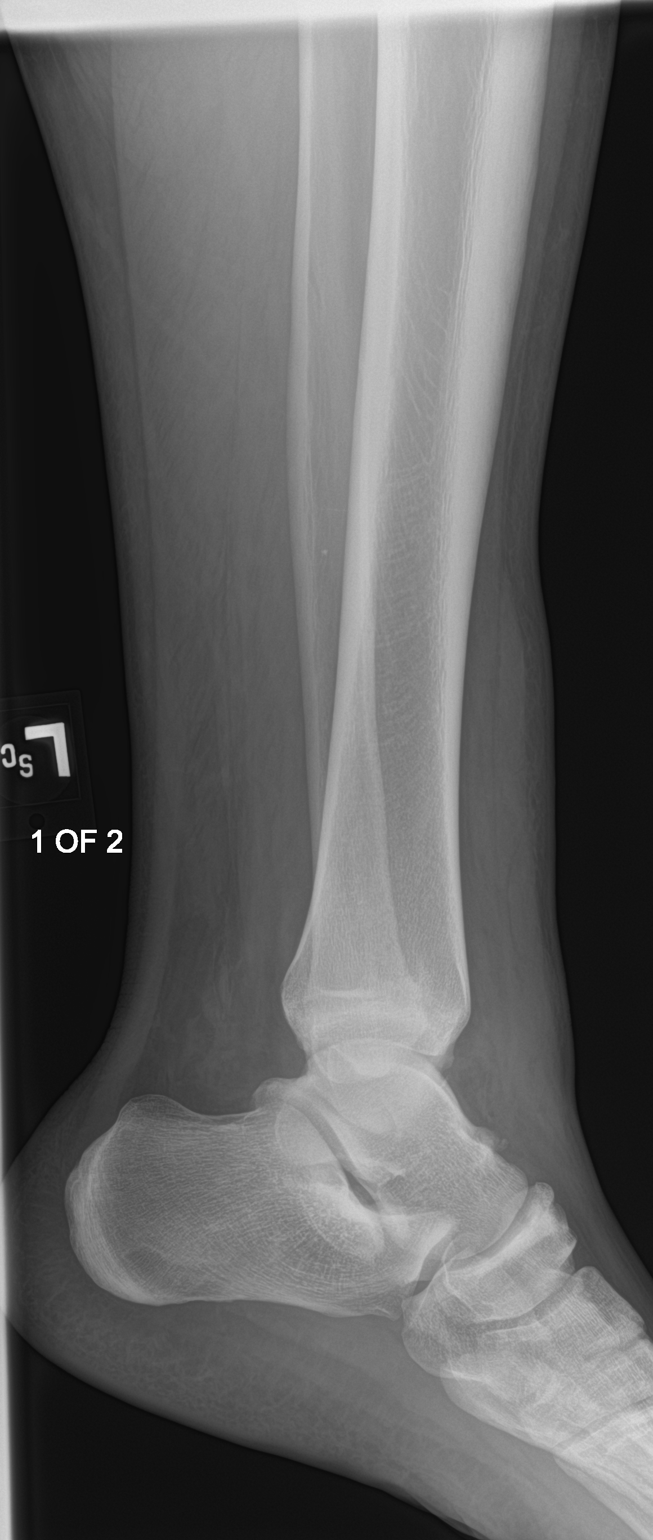
[im 4/4]
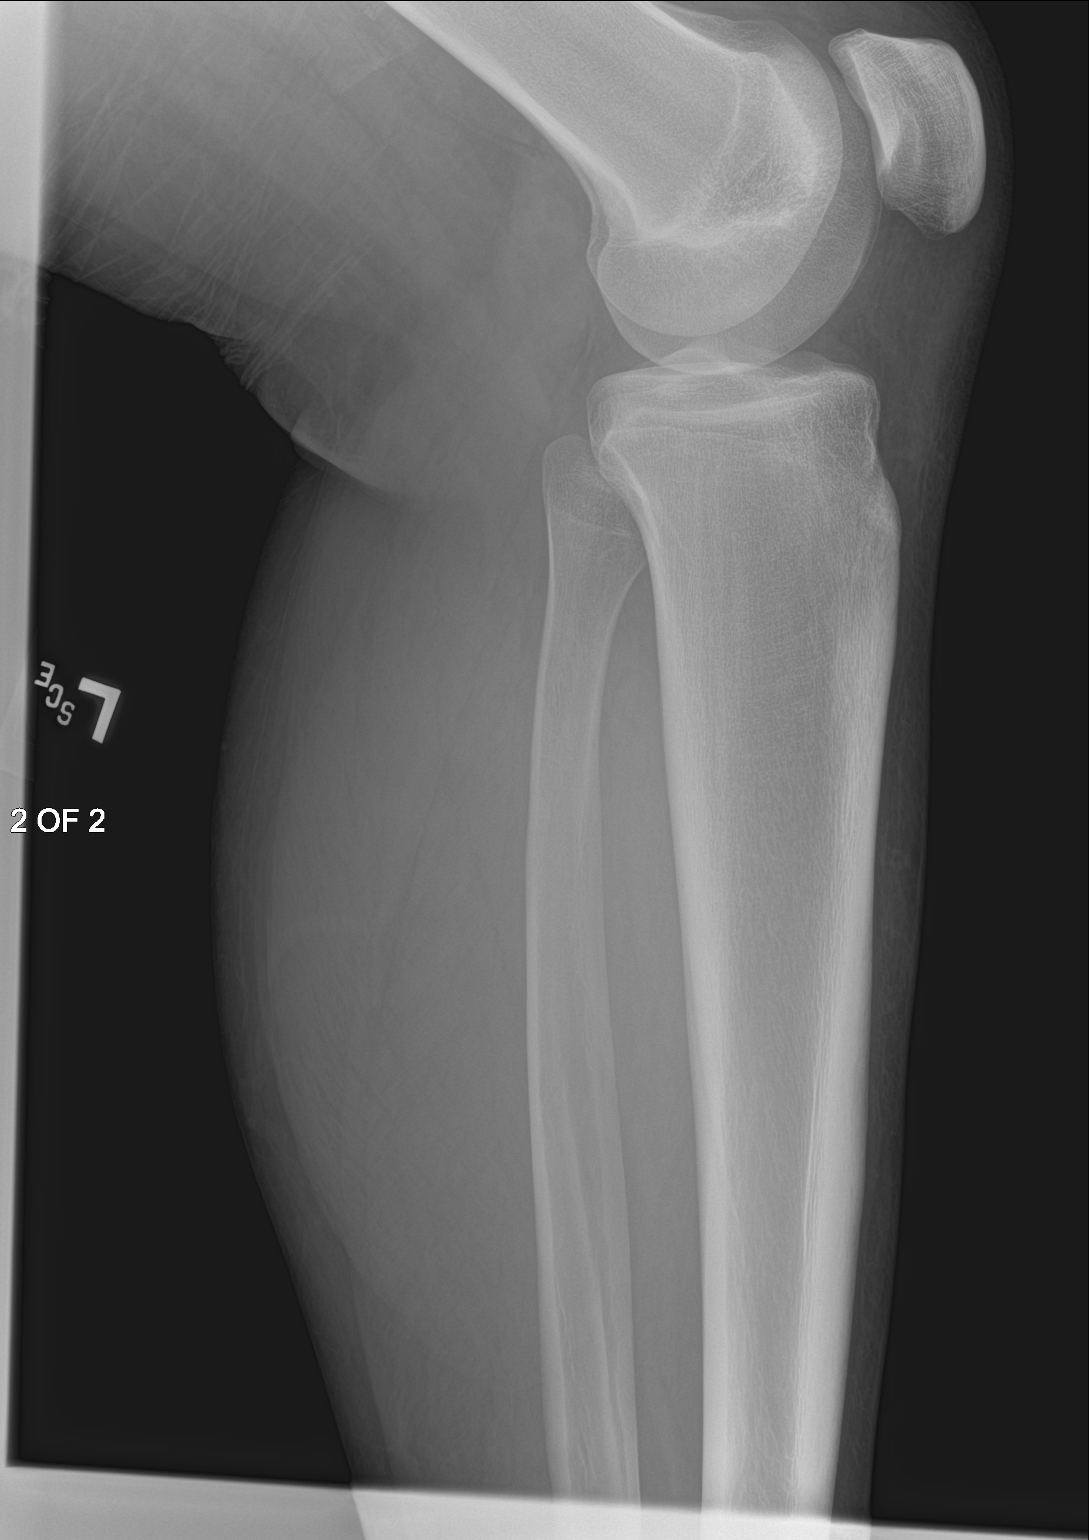

[4 of 4 positions shown; findings below may reference images not displayed]

FINDINGS: There is significant soft tissue swelling along the lateral and
anterior aspect of the ankle. A fracture the lateral malleolus is
possible, but not well demonstrated on the anterior view. Proximal
tibia and fibula are intact.
IMPRESSION: 1. Suspect fracture of the lateral malleolus.
2. Recommend dedicated ankle series for further evaluation.
3. Significant soft tissue swelling of the lateral and anterior
aspect of the ankle.

## 2018-05-09 ENCOUNTER — Emergency Department: Payer: Self-pay

## 2018-05-09 ENCOUNTER — Encounter: Payer: Self-pay | Admitting: Emergency Medicine

## 2018-05-09 ENCOUNTER — Emergency Department
Admission: EM | Admit: 2018-05-09 | Discharge: 2018-05-09 | Disposition: A | Discharge status: Home / Self Care | Payer: Self-pay | Attending: Emergency Medicine | Admitting: Emergency Medicine

## 2018-05-09 DIAGNOSIS — M948X1 Other specified disorders of cartilage, shoulder: Secondary | ICD-10-CM | POA: Insufficient documentation

## 2018-05-09 DIAGNOSIS — M25811 Other specified joint disorders, right shoulder: Secondary | ICD-10-CM

## 2018-05-09 MED ORDER — MELOXICAM 15 MG PO TABS: 15.0000 mg | ORAL_TABLET | Freq: Every day | ORAL | 0 refills | Status: AC

## 2018-05-09 MED ORDER — KETOROLAC TROMETHAMINE 30 MG/ML IJ SOLN
30.0000 mg | Freq: Once | INTRAMUSCULAR | Status: AC
  Administered 2018-05-09: 30 mg via INTRAMUSCULAR
  Filled 2018-05-09: qty 1

## 2018-05-09 NOTE — ED Provider Notes (Signed)
[facility] Emergency Department Provider Note  ____________________________________________  Time seen: Approximately 7:14 PM  I have reviewed the triage vital signs and the nursing notes.   HISTORY  Chief Complaint Shoulder Pain    HPI Cory Perry is a 22 y.o. male who presents emergency department complaining of ongoing shoulder pain times several months.  Patient reports that he was seen initially approximately 7 months ago with a complaint of shoulder pain.  He was given a shot of "cortisone" as well as patches to place on the area.  Patient reports that it helped before.  But he has ongoing similar pain at this time.  Patient is unsure whether there was an initial injury or not.  Patient states that pain radiates from posterior shoulder to the front.  He denies any associated shortness of breath, coughing, chest pain.  No medications for this complaint.  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  He denies any radicular symptoms.  He denies any other complaints at this time.    History reviewed. No pertinent past medical history.  There are no active problems to display for this patient.   History reviewed. No pertinent surgical history.  Prior to Admission medications   Medication Sig Start Date End Date Taking? Authorizing Provider  cephALEXin (KEFLEX) 500 MG capsule Take 1 capsule (500 mg total) by mouth 3 (three) times daily. 10/23/16   Tommi Rumps, PA-C  ibuprofen (ADVIL,MOTRIN) 800 MG tablet Take 1 tablet (800 mg total) by mouth every 8 (eight) hours as needed. 10/21/17   Enid Derry, PA-C  lidocaine (LIDODERM) 5 % Place 1 patch onto the skin every 12 (twelve) hours. Remove & Discard patch within 12 hours or as directed by MD 10/21/17 10/21/18  Enid Derry, PA-C  meloxicam (MOBIC) 15 MG tablet Take 1 tablet (15 mg total) by mouth daily. 05/09/18   Rivers Gassmann, Delorise Royals, PA-C    Allergies Patient has no known  allergies.  No family history on file.  Social History Social History   Tobacco Use  . Smoking status: Never Smoker  . Smokeless tobacco: Never Used  Substance Use Topics  . Alcohol use: No  . Drug use: Not on file     Review of Systems  Constitutional: No fever/chills Eyes: No visual changes.  Cardiovascular: no chest pain. Respiratory: no cough. No SOB. Gastrointestinal: No abdominal pain.  No nausea, no vomiting.  Musculoskeletal: Positive for right shoulder pain Skin: Negative for rash, abrasions, lacerations, ecchymosis. Neurological: Negative for headaches, focal weakness or numbness. 10-point ROS otherwise negative.  ____________________________________________   PHYSICAL EXAM:  VITAL SIGNS: ED Triage Vitals  Enc Vitals Group     BP 05/09/18 1843 138/75     Pulse Rate 05/09/18 1843 68     Resp 05/09/18 1843 17     Temp 05/09/18 1843 98.6 F (37 C)     Temp Source 05/09/18 1843 Oral     SpO2 05/09/18 1843 97 %     Weight 05/09/18 1843 255 lb (115.7 kg)     Height 05/09/18 1843 6\' 2"  (1.88 m)     Head Circumference --      Peak Flow --      Pain Score 05/09/18 1848 7     Pain Loc --      Pain Edu? --      Excl. in GC? --      Constitutional: Alert and oriented. Well appearing and in no acute  distress. Eyes: Conjunctivae are normal. PERRL. EOMI. Head: Atraumatic. ENT:      Ears:       Nose: No congestion/rhinnorhea.      Mouth/Throat: Mucous membranes are moist.  Neck: No stridor.  No cervical spine tenderness to palpation.  Cardiovascular: Normal rate, regular rhythm. Normal S1 and S2.  Good peripheral circulation. Respiratory: Normal respiratory effort without tachypnea or retractions. Lungs CTAB. Good air entry to the bases with no decreased or absent breath sounds. Musculoskeletal: Full range of motion to all extremities. No gross deformities appreciated.  Visualization of the right shoulder reveals no deformity, edema, ecchymosis, erythema.   Full range of motion to the right shoulder.  On palpation, patient is nontender to palpation over the osseous structures of the scapula, scapular spine, clavicle.  He is mildly tender to palpation along the rotator cuff distribution.  No palpable abnormality or deficits.  Special tests negative. Neurologic:  Normal speech and language. No gross focal neurologic deficits are appreciated.  Skin:  Skin is warm, dry and intact. No rash noted. Psychiatric: Mood and affect are normal. Speech and behavior are normal. Patient exhibits appropriate insight and judgement.   ____________________________________________   LABS (all labs ordered are listed, but only abnormal results are displayed)  Labs Reviewed - No data to display ____________________________________________  EKG   ____________________________________________  RADIOLOGY I personally viewed and evaluated these images as part of my medical decision making, as well as reviewing the written report by the radiologist.  No acute osseous fractures or dislocation.  Os acromiale visualized.  Dg Shoulder Right  Result Date: 05/09/2018 CLINICAL DATA:  Right shoulder pain EXAM: RIGHT SHOULDER - 2+ VIEW COMPARISON:  None. FINDINGS: Mesoacromial os acromiale. No malalignment at [facility]. Glenohumeral alignment appears normal. No additional significant findings. IMPRESSION: 1. No acute bony findings. 2. Mesoacromial os acromiale. Electronically Signed   By: Gaylyn RongWalter  Liebkemann M.D.   On: 05/09/2018 19:49    ____________________________________________    PROCEDURES  Procedure(s) performed:    Procedures    Medications  ketorolac (TORADOL) 30 MG/ML injection 30 mg (30 mg Intramuscular Given 05/09/18 2015)     ____________________________________________   INITIAL IMPRESSION / ASSESSMENT AND PLAN / ED COURSE  Pertinent labs & imaging results that were available during my care of the patient were reviewed by me and considered  in my medical decision making (see chart for details).  Review of the [place] CSRS was performed in accordance of the [facility] prior to dispensing any controlled drugs.      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  Patient presents with chronic right shoulder pain without known injury.  Exam was overall reassuring.  X-ray reveals os acromiale to the right shoulder.  This is likely positive for patient's pain.  Patient was given shot of Toradol and placed on meloxicam for symptom improvement.  He will follow-up with orthopedics for further management options. Patient is given ED precautions to return to the ED for any worsening or new symptoms.     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 Despite best efforts to proofread, errors can  occur which can change the meaning. Any change was purely unintentional.    Racheal Patches, PA-C 05/09/18 2018    Myrna Blazer, MD 05/09/18 [phone]

## 2018-05-09 NOTE — ED Notes (Signed)
This RN reviewed discharge instructions, follow-up care, prescriptions, and cryotherapy with patient. Patient verbalized understanding of all reviewed information.  Patient stable, with no distress noted at this time. 

## 2018-05-09 NOTE — ED Triage Notes (Signed)
PT arrived with complaints of right shoulder pain. Pt states he has had shoulder problems "for awhile" and that he has been seen here before and was given cortisone shots.

## 2022-06-14 ENCOUNTER — Ambulatory Visit
Admission: EM | Admit: 2022-06-14 | Discharge: 2022-06-14 | Disposition: A | Discharge status: Home / Self Care | Payer: Medicaid Other

## 2022-06-14 DIAGNOSIS — R1030 Lower abdominal pain, unspecified: Secondary | ICD-10-CM

## 2022-06-14 NOTE — ED Triage Notes (Signed)
Pt presents with c/o abdominal pain that began yesterday, pt also reports vomiting last night

## 2022-06-14 NOTE — ED Provider Notes (Signed)
[name]    CSN: [id number] Arrival date & time: 06/14/22  1159      History   Chief Complaint Chief Complaint  Patient presents with   Abdominal Pain   Emesis    HPI Cory Perry is a 25 y.o. male.   Patient presents with intermittent lower abdominal pain, abdominal bloating and increased gas production beginning 1 day ago.  Pain is worsened by changes in movement describes as a cramping sensation.  Had 1 episode of vomiting 1 day ago but has resolved.  Has been able to tolerate food and liquids since.  Endorses that he ate [place] food which is new 2 days ago prior to symptoms beginning.  Patient who ate similar food had an upset stomach as well. has not attempted treatment of symptoms.  Denies fever, chills, URI symptoms, heartburn or indigestion, diarrhea, constipation.  History reviewed. No pertinent past medical history.  There are no problems to display for this patient.   History reviewed. No pertinent surgical history.     Home Medications    Prior to Admission medications   Medication Sig Start Date End Date Taking? Authorizing Provider  cephALEXin (KEFLEX) 500 MG capsule Take 1 capsule (500 mg total) by mouth 3 (three) times daily. 10/23/16   Tommi Rumps, PA-C  ibuprofen (ADVIL,MOTRIN) 800 MG tablet Take 1 tablet (800 mg total) by mouth every 8 (eight) hours as needed. 10/21/17   Enid Derry, PA-C  meloxicam (MOBIC) 15 MG tablet Take 1 tablet (15 mg total) by mouth daily. 05/09/18   Cuthriell, Delorise Royals, PA-C    Family History History reviewed. No pertinent family history.  Social History Social History   Tobacco Use   Smoking status: Never   Smokeless tobacco: Never  Vaping Use   Vaping Use: Never used  Substance Use Topics   Alcohol use: No     Allergies   Patient has no known allergies.   Review of Systems Review of Systems  Constitutional: Negative.   Respiratory: Negative.    Cardiovascular: Negative.    Gastrointestinal:  Positive for abdominal pain and vomiting. Negative for abdominal distention, anal bleeding, blood in stool, constipation, diarrhea, nausea and rectal pain.  Genitourinary: Negative.      Physical Exam Triage Vital Signs ED Triage Vitals  Enc Vitals Group     BP 06/14/22 1213 126/82     Pulse Rate 06/14/22 1213 62     Resp 06/14/22 1213 18     Temp 06/14/22 1213 98.7 F (37.1 C)     Temp Source 06/14/22 1213 Oral     SpO2 06/14/22 1213 97 %     Weight --      Height --      Head Circumference --      Peak Flow --      Pain Score 06/14/22 1221 4     Pain Loc --      Pain Edu? --      Excl. in GC? --    No data found.  Updated Vital Signs BP 126/82 (BP Location: Left Arm)   Pulse 62   Temp 98.7 F (37.1 C) (Oral)   Resp 18   SpO2 97%   Visual Acuity Right Eye Distance:   Left Eye Distance:   Bilateral Distance:    Right Eye Near:   Left Eye Near:    Bilateral Near:     Physical Exam Constitutional:      Appearance: Normal appearance.  He is well-developed.  HENT:     Head: Normocephalic.  Eyes:     Extraocular Movements: Extraocular movements intact.  Pulmonary:     Effort: Pulmonary effort is normal.  Abdominal:     General: Abdomen is flat. Bowel sounds are normal. There is no distension.     Palpations: Abdomen is soft.     Tenderness: There is no abdominal tenderness. There is no guarding.  Skin:    General: Skin is warm and dry.  Neurological:     Mental Status: He is alert and oriented to person, place, and time. Mental status is at baseline.  Psychiatric:        Mood and Affect: Mood normal.        Behavior: Behavior normal.      UC Treatments / Results  Labs (all labs ordered are listed, but only abnormal results are displayed) Labs Reviewed - No data to display  EKG   Radiology No results found.  Procedures Procedures (including critical care time)  Medications Ordered in UC Medications - No data to  display  Initial Impression / Assessment and Plan / UC Course  I have reviewed the triage vital signs and the nursing notes.  Pertinent labs & imaging results that were available during my care of the patient were reviewed by me and considered in my medical decision making (see chart for details).  Lower abdominal pain  Vital signs are stable and patient is in no signs of distress nor toxic appearing, no tenderness is noted to the abdomen low suspicion for an acute cause, etiology is most likely related to introduction of new food as friends also had similar symptoms, recommended over-the-counter Pepto-Bismol and Maalox, diet declined prescription for famotidine, may use over-the-counter analgesics in addition for comfort, recommended a bland diet and increase fluid intake until appetite returns, may follow-up with urgent care as needed if symptoms persist Final Clinical Impressions(s) / UC Diagnoses   Final diagnoses:  Lower abdominal pain     Discharge Instructions      On exam there is no tenderness to your abdomen which he is comfort that this is not more serious involvement of the organs and most likely related to the [place] food that she recently ate causing irritation to the stomach  You may attempt use of over-the-counter Pepto-Bismol and/or Maalox for comfort, these medicines were to reduce gas as well as stomach acid which ideally will provide you some relief  Also try use of over-the-counter famotidine (Pepcid) to help reduce any stomach acid  May use Tylenol 500 to 1000 mg every 6 hours and/or ibuprofen 600 to 800 mg every 6-8 hours for comfort  Discomfort is present try eating a bland diet with avoidance of spicy or greasy foods to prevent further irritation  Increase your fluid intake and to you are able to tolerate food as normal to maintain your hydration  If your symptoms continue to persist I would recommend follow-up at our [facility] urgent care clinic or at the  nearest hospital as we are unable to complete any abdominal imaging here at the [facility] urgent care   ED Prescriptions   None    PDMP not reviewed this encounter.   Valinda Hoar, NP 06/14/22 1352

## 2022-06-14 NOTE — Discharge Instructions (Signed)
On exam there is no tenderness to your abdomen which he is comfort that this is not more serious involvement of the organs and most likely related to the [place] food that she recently ate causing irritation to the stomach  You may attempt use of over-the-counter Pepto-Bismol and/or Maalox for comfort, these medicines were to reduce gas as well as stomach acid which ideally will provide you some relief  Also try use of over-the-counter famotidine (Pepcid) to help reduce any stomach acid  May use Tylenol 500 to 1000 mg every 6 hours and/or ibuprofen 600 to 800 mg every 6-8 hours for comfort  Discomfort is present try eating a bland diet with avoidance of spicy or greasy foods to prevent further irritation  Increase your fluid intake and to you are able to tolerate food as normal to maintain your hydration  If your symptoms continue to persist I would recommend follow-up at our [facility] urgent care clinic or at the nearest hospital as we are unable to complete any abdominal imaging here at the [facility] urgent care

## 2022-07-14 ENCOUNTER — Emergency Department (HOSPITAL_COMMUNITY): Payer: Self-pay

## 2022-07-14 ENCOUNTER — Other Ambulatory Visit: Payer: Self-pay

## 2022-07-14 ENCOUNTER — Encounter (HOSPITAL_COMMUNITY): Payer: Self-pay | Admitting: Emergency Medicine

## 2022-07-14 ENCOUNTER — Emergency Department (HOSPITAL_COMMUNITY): Payer: Medicaid Other

## 2022-07-14 ENCOUNTER — Emergency Department (HOSPITAL_COMMUNITY)
Admission: EM | Admit: 2022-07-14 | Discharge: 2022-07-14 | Disposition: A | Discharge status: Home / Self Care | Payer: Self-pay | Attending: Emergency Medicine | Admitting: Emergency Medicine

## 2022-07-14 DIAGNOSIS — S52515A Nondisplaced fracture of left radial styloid process, initial encounter for closed fracture: Secondary | ICD-10-CM | POA: Diagnosis not present

## 2022-07-14 DIAGNOSIS — M25522 Pain in left elbow: Secondary | ICD-10-CM | POA: Diagnosis present

## 2022-07-14 DIAGNOSIS — S0101XA Laceration without foreign body of scalp, initial encounter: Secondary | ICD-10-CM | POA: Insufficient documentation

## 2022-07-14 DIAGNOSIS — Z23 Encounter for immunization: Secondary | ICD-10-CM | POA: Diagnosis not present

## 2022-07-14 DIAGNOSIS — S51012A Laceration without foreign body of left elbow, initial encounter: Secondary | ICD-10-CM | POA: Insufficient documentation

## 2022-07-14 DIAGNOSIS — M542 Cervicalgia: Secondary | ICD-10-CM | POA: Diagnosis not present

## 2022-07-14 DIAGNOSIS — S62122A Displaced fracture of lunate [semilunar], left wrist, initial encounter for closed fracture: Secondary | ICD-10-CM | POA: Insufficient documentation

## 2022-07-14 DIAGNOSIS — Y92410 Unspecified street and highway as the place of occurrence of the external cause: Secondary | ICD-10-CM | POA: Diagnosis not present

## 2022-07-14 MED ORDER — TETANUS-DIPHTH-ACELL PERTUSSIS 5-2.5-18.5 LF-MCG/0.5 IM SUSY
0.5000 mL | PREFILLED_SYRINGE | Freq: Once | INTRAMUSCULAR | Status: AC
  Administered 2022-07-14: 0.5 mL via INTRAMUSCULAR
  Filled 2022-07-14: qty 0.5

## 2022-07-14 MED ORDER — OXYCODONE-ACETAMINOPHEN 5-325 MG PO TABS
1.0000 | ORAL_TABLET | Freq: Once | ORAL | Status: AC
  Administered 2022-07-14: 1 via ORAL
  Filled 2022-07-14: qty 1

## 2022-07-14 MED ORDER — LIDOCAINE-EPINEPHRINE (PF) 2 %-1:200000 IJ SOLN
20.0000 mL | Freq: Once | INTRAMUSCULAR | Status: AC
  Administered 2022-07-14: 20 mL
  Filled 2022-07-14: qty 20

## 2022-07-14 MED ORDER — OXYCODONE HCL 5 MG PO TABS: 5.0000 mg | ORAL_TABLET | ORAL | 0 refills | Status: AC | PRN

## 2022-07-14 NOTE — Discharge Instructions (Addendum)
Please make an appointment with the hand specialist to take care of the fractures in your wrist.  In the meantime, wear the splint at all times.  Apply ice and keep your hand elevated is much as possible.  You may take ibuprofen and/or acetaminophen as needed for pain.  Please note that if you combine ibuprofen and acetaminophen, you will get better pain relief and you get from taking either medication by itself.  If you are still not getting sufficient pain relief, you may add oxycodone.

## 2022-07-14 NOTE — ED Notes (Signed)
Dressing applied to left elbow.

## 2022-07-14 NOTE — Progress Notes (Signed)
Orthopedic Tech Progress Note Patient Details:  Cory Perry [birth year] [id number]  Ortho Devices Type of Ortho Device: Thumb spica splint Splint Material: Fiberglass Ortho Device/Splint Location: LUE Ortho Device/Splint Interventions: Ordered, Application   Post Interventions Patient Tolerated: Well Instructions Provided: Poper ambulation with device, Care of device  Cory Perry 07/14/2022, 7:31 AM
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
## 2022-07-17 ENCOUNTER — Emergency Department (HOSPITAL_COMMUNITY)
Admission: EM | Admit: 2022-07-17 | Discharge: 2022-07-17 | Discharge status: Against Medical Advice | Payer: Medicaid Other | Attending: Physician Assistant | Admitting: Physician Assistant

## 2022-07-17 ENCOUNTER — Encounter (HOSPITAL_COMMUNITY): Payer: Self-pay

## 2022-07-17 ENCOUNTER — Emergency Department (HOSPITAL_COMMUNITY): Payer: Medicaid Other

## 2022-07-17 ENCOUNTER — Other Ambulatory Visit: Payer: Self-pay

## 2022-07-17 DIAGNOSIS — R200 Anesthesia of skin: Secondary | ICD-10-CM | POA: Insufficient documentation

## 2022-07-17 DIAGNOSIS — Z5321 Procedure and treatment not carried out due to patient leaving prior to being seen by health care provider: Secondary | ICD-10-CM | POA: Insufficient documentation

## 2022-07-17 DIAGNOSIS — R0789 Other chest pain: Secondary | ICD-10-CM | POA: Insufficient documentation

## 2022-07-17 DIAGNOSIS — M25511 Pain in right shoulder: Secondary | ICD-10-CM | POA: Insufficient documentation
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
## 2022-07-17 NOTE — ED Notes (Signed)
Patient called for vitals no response 
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
## 2022-07-17 NOTE — ED Notes (Signed)
Pt did not answer for vital update will try again in 15 mins. 

## 2022-07-17 NOTE — ED Notes (Signed)
Pt no wanted to wait.
